# Patient Record
Sex: Female | Born: 2000 | Race: White | Hispanic: No | Marital: Single | Smoking: Never smoker
Health system: Southern US, Community
[De-identification: ages and names within clinical notes are randomized; demographics above are authoritative.]

## PROBLEM LIST (undated history)

## (undated) DIAGNOSIS — R55 Syncope and collapse: Secondary | ICD-10-CM

## (undated) DIAGNOSIS — O99824 Streptococcus B carrier state complicating childbirth: Secondary | ICD-10-CM

## (undated) DIAGNOSIS — R569 Unspecified convulsions: Secondary | ICD-10-CM

---
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
## 2024-09-12 ENCOUNTER — Ambulatory Visit: Admitting: Family Medicine

## 2024-09-12 ENCOUNTER — Encounter: Payer: Self-pay | Admitting: Family Medicine

## 2024-09-12 VITALS — BP 113/75 | HR 108 | Ht 60.0 in | Wt 128.0 lb

## 2024-09-12 DIAGNOSIS — Z23 Encounter for immunization: Secondary | ICD-10-CM

## 2024-09-12 DIAGNOSIS — Z111 Encounter for screening for respiratory tuberculosis: Secondary | ICD-10-CM

## 2024-09-12 DIAGNOSIS — G43009 Migraine without aura, not intractable, without status migrainosus: Secondary | ICD-10-CM

## 2024-09-12 NOTE — Progress Notes (Unsigned)
 PPD Placement note Jill Bradley, 23 y.o. female is here today for placement of PPD test Reason for PPD test: work Pt taken PPD test before: unknown Verified in allergy area and with patient that they are not allergic to the products PPD is made of (Phenol or Tween). Yes Is patient taking any oral or IV steroid medication now or have they taken it in the last month? no Has the patient ever received the BCG vaccine?: no Has the patient been in recent contact with anyone known or suspected of having active TB disease?: no      Date of exposure (if applicable): n/a      Name of person they were exposed to (if applicable): n/a Patient's Country of origin?: [place]  O: Alert and oriented in NAD. P:  PPD placed on 09/12/2024@ 10:25am on rt forearm/ wheal present  Patient advised to return for reading within 48-72 hours. Scheduled appt at departure.

## 2024-09-13 NOTE — Assessment & Plan Note (Signed)
 Well-controlled.  Has learned her triggers.

## 2024-09-13 NOTE — Progress Notes (Unsigned)
 New Patient Office Visit  Subjective    Patient ID: Jill Bradley, female    DOB: [birth year]  Age: 23 y.o. MRN: [id number]  CC:  Chief Complaint  Patient presents with   Establish Care    HPI Jill Bradley presents to establish care Discussed the use of AI scribe software for clinical note transcription with the patient, who gave verbal consent to proceed.  History of Present Illness   Jill Bradley is a 23 year old female who presents for a TB test and tetanus booster.  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 She has opted for the skin test, which requires her to return in 48 to 72 hours for evaluation.  She is also due for a tetanus booster, as her last booster was on June 20, 2014. She is concerned about ensuring her immunizations are up to date for her student teaching requirements.  She has a history of migraines that began during the COVID pandemic. She has been taking magnesium supplements, which were recommended to help with her migraines and anxiety. She is unsure of the dosage but takes it in the morning. Her migraines have improved significantly, with triggers identified as lack of sleep, inadequate nutrition, and weather changes. She also uses blue light glasses to mitigate screen-related triggers.  No current issues with vision, hearing, heart disease, lung disease, or back problems. She is not on any birth control and has not had a Pap test yet, which is recommended at age 76.  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      Outpatient Encounter Medications as of 09/12/2024  Medication Sig   Magnesium 300 MG CAPS Take by mouth.   ondansetron (ZOFRAN) 4 MG/5ML solution  (Patient not taking: Reported on 09/12/2024)   Riboflavin 100 MG CAPS Take by mouth. (Patient not taking: Reported on 09/12/2024)   Riboflavin-Magnesium-Feverfew (MIGRELIEF) 200-180-50 MG TABS Take 2 tablets daily  (Patient not taking: Reported on 09/12/2024)   No facility-administered encounter medications on file as of 09/12/2024.    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 was kept here at [facility] for 11 days   Syncope     No past surgical history on file.  Family History  Problem Relation Age of Onset   Diabetes Maternal Grandmother    Cancer Maternal Grandfather    Mental illness Maternal Uncle        scizophrenia    Social History   Socioeconomic History   Marital status: Single    Spouse name: Not on file   Number of children: Not on file   Years of education: Not on file   Highest education level: Not on file  Occupational History   Not on file  Tobacco Use   Smoking status: Never   Smokeless tobacco: Never  Substance and Sexual Activity   Alcohol use: No   Drug use: No   Sexual activity: Never  Other Topics Concern   Not on file  Social History Narrative   Pt lives at home with both parents and maternal grandmother. Family has 2 cats and 3 dogs.       Britney is an 11th grade student.   She attends [employer].   She lives with both parents.   She has no siblings.   Social Drivers of  Health   Financial Resource Strain: Not on file  Food Insecurity: Not on file  Transportation Needs: Not on file  Physical Activity: Not on file  Stress: Not on file  Social Connections: Not on file  Intimate Partner Violence: Not on file    ROS      Objective   BP 113/75   Pulse (!) 108   Ht 5' (1.524 m)   Wt 128 lb (58.1 kg)   LMP 08/22/2024 (Approximate)   SpO2 96%   BMI 25.00 kg/m  {Vitals History (Optional):23777}  Physical Exam Vitals and nursing note reviewed.  Constitutional:      Appearance: Normal appearance.  HENT:     Head: Normocephalic and atraumatic.  Eyes:     Conjunctiva/sclera: Conjunctivae normal.  Cardiovascular:     Rate and Rhythm: Normal rate  and regular rhythm.  Pulmonary:     Effort: Pulmonary effort is normal.     Breath sounds: Normal breath sounds.  Musculoskeletal:     Right lower leg: No edema.     Left lower leg: No edema.  Skin:    General: Skin is warm and dry.  Neurological:     Mental Status: She is alert and oriented to person, place, and time.  Psychiatric:        Mood and Affect: Mood normal.        Behavior: Behavior normal.        Thought Content: Thought content normal.        Judgment: Judgment normal.      {Perform Simple Foot Exam  Perform Detailed exam:1} {Insert foot Exam (Optional):30965}   {Labs (Optional):23779}  The ASCVD Risk score (Arnett DK, et al., 2019) failed to calculate for the following reasons:   The 2019 ASCVD risk score is only valid for ages 33 to 61     Assessment & Plan:  Encounter for immunization -     Tdap vaccine greater than or equal to 7yo IM  Visit for TB skin test -     TB Skin Test  Migraine without aura and without status migrainosus, not intractable Assessment & Plan: Well-controlled.  Has learned her triggers.     Return if symptoms worsen or fail to improve.   Laquenta Whitsell K Brenon Antosh, MD

## 2024-09-14 ENCOUNTER — Ambulatory Visit: Payer: Self-pay

## 2024-09-14 LAB — TB SKIN TEST
Induration: 0 mm
TB Skin Test: NEGATIVE

## 2024-09-14 NOTE — Progress Notes (Signed)
 PPD Reading Note  PPD read and results entered in Epic[employer].  Result: 00 mm induration.  Interpretation: neg  If test not read within 48-72 hours of initial placement, patient advised to repeat in other arm 1-3 weeks after this test.  Allergic reaction: no

## 2025-03-15 ENCOUNTER — Ambulatory Visit: Payer: Self-pay
